# Patient Record
Sex: Female | Born: 1935 | Hispanic: Yes | Smoking: Never smoker
Health system: Southern US, Community
[De-identification: ages and names within clinical notes are randomized; demographics above are authoritative.]

## PROBLEM LIST (undated history)

## (undated) DIAGNOSIS — I10 Essential (primary) hypertension: Secondary | ICD-10-CM

---

## 2005-01-25 ENCOUNTER — Emergency Department: Payer: Self-pay | Admitting: Emergency Medicine

## 2006-02-21 ENCOUNTER — Emergency Department: Payer: Self-pay | Admitting: Emergency Medicine

## 2015-09-12 ENCOUNTER — Other Ambulatory Visit: Payer: Self-pay | Admitting: Internal Medicine

## 2015-09-12 DIAGNOSIS — R42 Dizziness and giddiness: Secondary | ICD-10-CM

## 2015-09-12 DIAGNOSIS — R413 Other amnesia: Secondary | ICD-10-CM

## 2015-10-02 ENCOUNTER — Ambulatory Visit: Payer: Medicare Other

## 2015-10-23 ENCOUNTER — Ambulatory Visit
Admission: RE | Admit: 2015-10-23 | Discharge: 2015-10-23 | Disposition: A | Discharge status: Home / Self Care | Payer: Medicare Other | Source: Ambulatory Visit | Attending: Internal Medicine | Admitting: Internal Medicine

## 2015-10-23 DIAGNOSIS — R42 Dizziness and giddiness: Secondary | ICD-10-CM | POA: Diagnosis present

## 2015-10-23 DIAGNOSIS — R2689 Other abnormalities of gait and mobility: Secondary | ICD-10-CM | POA: Diagnosis present

## 2015-10-23 DIAGNOSIS — R413 Other amnesia: Secondary | ICD-10-CM

## 2015-10-23 LAB — POCT I-STAT CREATININE: CREATININE: 0.5 mg/dL (ref 0.44–1.00)

## 2015-10-23 MED ORDER — GADOBENATE DIMEGLUMINE 529 MG/ML IV SOLN
10.0000 mL | Freq: Once | INTRAVENOUS | Status: AC | PRN
  Administered 2015-10-23: 9 mL via INTRAVENOUS

## 2016-03-19 ENCOUNTER — Other Ambulatory Visit: Payer: Self-pay | Admitting: Internal Medicine

## 2016-03-19 DIAGNOSIS — Z1239 Encounter for other screening for malignant neoplasm of breast: Secondary | ICD-10-CM

## 2016-03-27 ENCOUNTER — Encounter: Payer: Self-pay | Admitting: Emergency Medicine

## 2016-03-27 DIAGNOSIS — I10 Essential (primary) hypertension: Secondary | ICD-10-CM | POA: Diagnosis not present

## 2016-03-27 DIAGNOSIS — R030 Elevated blood-pressure reading, without diagnosis of hypertension: Secondary | ICD-10-CM | POA: Diagnosis present

## 2016-03-27 LAB — BASIC METABOLIC PANEL
Anion gap: 8 (ref 5–15)
BUN: 10 mg/dL (ref 6–20)
CHLORIDE: 106 mmol/L (ref 101–111)
CO2: 26 mmol/L (ref 22–32)
CREATININE: 0.49 mg/dL (ref 0.44–1.00)
Calcium: 9.4 mg/dL (ref 8.9–10.3)
GFR calc Af Amer: >60 mL/min (ref >60)
GFR calc non Af Amer: >60 mL/min (ref >60)
GLUCOSE: 108 mg/dL — AB (ref 65–99)
Potassium: 4.3 mmol/L (ref 3.5–5.1)
Sodium: 140 mmol/L (ref 135–145)

## 2016-03-27 LAB — CBC
HCT: 42.8 % (ref 35.0–47.0)
Hemoglobin: 15.1 g/dL (ref 12.0–16.0)
MCH: 31.8 pg (ref 26.0–34.0)
MCHC: 35.2 g/dL (ref 32.0–36.0)
MCV: 90.4 fL (ref 80.0–100.0)
PLATELETS: 136 10*3/uL — AB (ref 150–440)
RBC: 4.74 MIL/uL (ref 3.80–5.20)
RDW: 13.3 % (ref 11.5–14.5)
WBC: 8.4 10*3/uL (ref 3.6–11.0)

## 2016-03-27 LAB — TROPONIN I: Troponin I: <0.03 ng/mL (ref <0.03)

## 2016-03-27 NOTE — ED Triage Notes (Signed)
Patient reports that she checked her blood pressure at home today and the systolic was over 200. Patient has a history of hypertension but it had improved so her dr took her bp medication about a year ago.

## 2016-03-28 ENCOUNTER — Emergency Department
Admission: EM | Admit: 2016-03-28 | Discharge: 2016-03-28 | Disposition: A | Discharge status: Home / Self Care | Payer: Medicare Other | Attending: Emergency Medicine | Admitting: Emergency Medicine

## 2016-03-28 DIAGNOSIS — I10 Essential (primary) hypertension: Secondary | ICD-10-CM | POA: Diagnosis not present

## 2016-03-28 MED ORDER — HYDROCHLOROTHIAZIDE 12.5 MG PO TABS: 12.5000 mg | ORAL_TABLET | Freq: Every day | ORAL | 1 refills | Status: AC

## 2016-03-28 MED ORDER — HYDROCHLOROTHIAZIDE 12.5 MG PO CAPS
12.5000 mg | ORAL_CAPSULE | Freq: Once | ORAL | Status: AC
  Administered 2016-03-28: 12.5 mg via ORAL
  Filled 2016-03-28: qty 1

## 2016-03-28 NOTE — ED Notes (Addendum)
Patient presented to ED with complaints of hypertension. She has a history of hypertension. Family brought her in because her systolic pressure was registering over 200 at home. Patients pressure has steadily improved. Family has remained present at bedside during stay. Patient has no complaints of pain.

## 2016-03-28 NOTE — ED Provider Notes (Signed)
[facility] Emergency Department Provider Note  Time seen: 1:51 AM  I have reviewed the triage vital signs and the nursing notes.   HISTORY  Chief Complaint Hypertension    HPI Herma Meringdelina Brotz is a 80 y.o. female with a past medical history of hypertension off medications for the past one year who presents to the emergency department from them elevated blood pressure. According to the patient and family they took the patient's blood pressure tonight and it was 170 systolic, they waited approximately 30 minutes and took it again it was 185, the tech it again and it was over 200 so they brought the patient to the emergency department for evaluation. Patient denies any chest pain, daughter states she was feeling dizzy earlier today but does not currently. Patient's blood pressure initially upon arrival to the emergency department is 205 systolic, currently 170 systolic. Daughter states the patient was on blood pressure medications for many years but was taken off of them one year ago. Patient saw her doctor August 25, but states the blood pressure was elevated at that time but the doctor did not restart the medications. Per family the doctor told them that the blood pressure spikes up high and the need to get the emergency department for evaluation which is why they came tonight.  History reviewed. No pertinent past medical history.  There are no active problems to display for this patient.   History reviewed. No pertinent surgical history.  Prior to Admission medications   Not on File    No Known Allergies  No family history on file.  Social History Social History  Substance Use Topics  . Smoking status: Never Smoker  . Smokeless tobacco: Never Used  . Alcohol use Not on file    Review of Systems, Per patient and family. Constitutional: Negative for fever. Cardiovascular: Negative for chest pain. Respiratory: Negative for shortness of  breath. Neurological: Negative for headache 10-point ROS otherwise negative.  ____________________________________________   PHYSICAL EXAM:  VITAL SIGNS: ED Triage Vitals [03/27/16 2251]  Enc Vitals Group     BP (!) 205/76     Pulse Rate 63     Resp 18     Temp 98.2 F (36.8 C)     Temp Source Oral     SpO2 96 %     Weight 107 lb (48.5 kg)     Height 4\' 11"  (1.499 m)     Head Circumference      Peak Flow      Pain Score      Pain Loc      Pain Edu?      Excl. in GC?     Constitutional: Alert. Well appearing and in no distress. Eyes: Normal exam ENT   Head: Normocephalic and atraumatic.   Mouth/Throat: Mucous membranes are moist. Cardiovascular: Normal rate, regular rhythm.  Respiratory: Normal respiratory effort without tachypnea nor retractions. Breath sounds are clear Gastrointestinal: Soft and nontender. No distention.  Musculoskeletal: Nontender with normal range of motion in all extremities. No lower extremity tenderness or edema. Neurologic:  No gross focal neurologic deficits  Skin:  Skin is warm, dry and intact.  Psychiatric: Mood and affect are normal. S  ____________________________________________    EKG  EKG reviewed and interpreted by myself shows normal sinus rhythm at 62 bpm, narrow QRS, normal axis, normal intervals, no ST changes. Normal EKG.  ____________________________________________     INITIAL IMPRESSION / ASSESSMENT AND PLAN / ED COURSE  Pertinent labs &  imaging results that were available during my care of the patient were reviewed by me and considered in my medical decision making (see chart for details).  Patient presents to the emergency department with hypertension, greater 200 prior to arrival. Upon arrival patient's blood pressure is 205 systolic now 170. Patient has no complaints at this time. Patient's workup including labs and EKG are very normal. We will restart the patient on blood pressure medication and have her  follow-up with a primary care doctor in 1 week for recheck. Patient family are agreeable to this plan.  ____________________________________________   FINAL CLINICAL IMPRESSION(S) / ED DIAGNOSES  Hypertension    Minna Antis, MD 03/28/16 0157

## 2016-04-07 ENCOUNTER — Ambulatory Visit: Payer: Medicare Other | Attending: Internal Medicine

## 2016-05-05 ENCOUNTER — Other Ambulatory Visit: Payer: Self-pay | Admitting: Gastroenterology

## 2016-05-05 DIAGNOSIS — R7989 Other specified abnormal findings of blood chemistry: Secondary | ICD-10-CM

## 2016-05-05 DIAGNOSIS — R945 Abnormal results of liver function studies: Secondary | ICD-10-CM

## 2016-05-05 DIAGNOSIS — Z862 Personal history of diseases of the blood and blood-forming organs and certain disorders involving the immune mechanism: Secondary | ICD-10-CM

## 2016-05-12 ENCOUNTER — Ambulatory Visit
Admission: RE | Admit: 2016-05-12 | Discharge: 2016-05-12 | Disposition: A | Discharge status: Home / Self Care | Payer: Medicare Other | Source: Ambulatory Visit | Attending: Gastroenterology | Admitting: Gastroenterology

## 2016-05-12 DIAGNOSIS — Z862 Personal history of diseases of the blood and blood-forming organs and certain disorders involving the immune mechanism: Secondary | ICD-10-CM | POA: Diagnosis present

## 2016-05-12 DIAGNOSIS — R7989 Other specified abnormal findings of blood chemistry: Secondary | ICD-10-CM | POA: Diagnosis present

## 2016-05-12 DIAGNOSIS — K8020 Calculus of gallbladder without cholecystitis without obstruction: Secondary | ICD-10-CM | POA: Insufficient documentation

## 2016-05-12 DIAGNOSIS — R945 Abnormal results of liver function studies: Secondary | ICD-10-CM

## 2016-05-12 DIAGNOSIS — K760 Fatty (change of) liver, not elsewhere classified: Secondary | ICD-10-CM | POA: Diagnosis not present

## 2016-06-08 ENCOUNTER — Other Ambulatory Visit
Admission: RE | Admit: 2016-06-08 | Discharge: 2016-06-08 | Disposition: A | Discharge status: Home / Self Care | Payer: Medicare Other | Source: Ambulatory Visit | Attending: Neurology | Admitting: Neurology

## 2016-06-08 DIAGNOSIS — R413 Other amnesia: Secondary | ICD-10-CM | POA: Diagnosis present

## 2016-06-08 LAB — AMMONIA: AMMONIA: 18 umol/L (ref 9–35)

## 2016-07-14 ENCOUNTER — Encounter: Payer: Self-pay | Admitting: Emergency Medicine

## 2016-07-14 ENCOUNTER — Emergency Department
Admission: EM | Admit: 2016-07-14 | Discharge: 2016-07-14 | Disposition: A | Discharge status: Home / Self Care | Payer: Medicare Other | Attending: Emergency Medicine | Admitting: Emergency Medicine

## 2016-07-14 DIAGNOSIS — R109 Unspecified abdominal pain: Secondary | ICD-10-CM | POA: Insufficient documentation

## 2016-07-14 DIAGNOSIS — I10 Essential (primary) hypertension: Secondary | ICD-10-CM | POA: Diagnosis not present

## 2016-07-14 DIAGNOSIS — Z79899 Other long term (current) drug therapy: Secondary | ICD-10-CM | POA: Insufficient documentation

## 2016-07-14 DIAGNOSIS — R197 Diarrhea, unspecified: Secondary | ICD-10-CM | POA: Diagnosis not present

## 2016-07-14 HISTORY — DX: Essential (primary) hypertension: I10

## 2016-07-14 LAB — CBC
HEMATOCRIT: 39.4 % (ref 35.0–47.0)
HEMOGLOBIN: 13.8 g/dL (ref 12.0–16.0)
MCH: 31.7 pg (ref 26.0–34.0)
MCHC: 35.1 g/dL (ref 32.0–36.0)
MCV: 90.4 fL (ref 80.0–100.0)
Platelets: 135 10*3/uL — ABNORMAL LOW (ref 150–440)
RBC: 4.35 MIL/uL (ref 3.80–5.20)
RDW: 13.1 % (ref 11.5–14.5)
WBC: 5.6 10*3/uL (ref 3.6–11.0)

## 2016-07-14 LAB — COMPREHENSIVE METABOLIC PANEL
ALT: 45 U/L (ref 14–54)
ANION GAP: 9 (ref 5–15)
AST: 65 U/L — ABNORMAL HIGH (ref 15–41)
Albumin: 4.2 g/dL (ref 3.5–5.0)
Alkaline Phosphatase: 74 U/L (ref 38–126)
BILIRUBIN TOTAL: 0.7 mg/dL (ref 0.3–1.2)
BUN: 11 mg/dL (ref 6–20)
CO2: 26 mmol/L (ref 22–32)
Calcium: 8.7 mg/dL — ABNORMAL LOW (ref 8.9–10.3)
Chloride: 104 mmol/L (ref 101–111)
Creatinine, Ser: 0.53 mg/dL (ref 0.44–1.00)
GFR calc Af Amer: >60 mL/min (ref >60)
Glucose, Bld: 111 mg/dL — ABNORMAL HIGH (ref 65–99)
POTASSIUM: 3.1 mmol/L — AB (ref 3.5–5.1)
Sodium: 139 mmol/L (ref 135–145)
TOTAL PROTEIN: 7.5 g/dL (ref 6.5–8.1)

## 2016-07-14 LAB — URINALYSIS, ROUTINE W REFLEX MICROSCOPIC
BILIRUBIN URINE: NEGATIVE
Glucose, UA: NEGATIVE mg/dL
KETONES UR: NEGATIVE mg/dL
NITRITE: NEGATIVE
PH: 8 (ref 5.0–8.0)
Protein, ur: NEGATIVE mg/dL
Specific Gravity, Urine: 1.003 — ABNORMAL LOW (ref 1.005–1.030)

## 2016-07-14 LAB — MAGNESIUM: Magnesium: 2.5 mg/dL — ABNORMAL HIGH (ref 1.7–2.4)

## 2016-07-14 LAB — LIPASE, BLOOD: Lipase: 36 U/L (ref 11–51)

## 2016-07-14 MED ORDER — POTASSIUM CHLORIDE CRYS ER 20 MEQ PO TBCR
40.0000 meq | EXTENDED_RELEASE_TABLET | Freq: Once | ORAL | Status: AC
  Administered 2016-07-14: 40 meq via ORAL
  Filled 2016-07-14: qty 2

## 2016-07-14 MED ORDER — AMLODIPINE BESYLATE 5 MG PO TABS
5.0000 mg | ORAL_TABLET | Freq: Once | ORAL | Status: DC
  Filled 2016-07-14: qty 1

## 2016-07-14 MED ORDER — POTASSIUM CHLORIDE CRYS ER 20 MEQ PO TBCR: 20.0000 meq | EXTENDED_RELEASE_TABLET | Freq: Every day | ORAL | 0 refills | Status: AC

## 2016-07-14 NOTE — ED Provider Notes (Signed)
[facility] Emergency Department Provider Note  ____________________________________________   First MD Initiated Contact with Patient 07/14/16 2042     (approximate)  I have reviewed the triage vital signs and the nursing notes.   HISTORY  Chief Complaint Abdominal Pain and Diarrhea  The patient and/or family speak(s) Spanish.  They understand they have the right to the use of a hospital interpreter, however at this time they prefer to speak directly with me in Spanish.  They know that they can ask for an interpreter at any time.   HPI Carla Cabrera is a 80 y.o. female with only a PMH of HTN who presents for 2 days of diarrhea and cramping abdominal pain.  Started with pain and bloating after eating about 2 days ago, felt distended and gassy, then developed copious watery diarrhea.  Numerous episodes of diarrhea over the last 2 days, though it has been better today.  Denies nausea and vomiting.  No other family members have been ill.  Denies fever/chills, CP, SOB, dysuria.  Severe intensity, nothing makes better or worse, but better today; family just concerned about persistent bloated sensation, diarrhea, and intermittent cramping abdominal pain.   Past Medical History:  Diagnosis Date  . Hypertension     There are no active problems to display for this patient.   History reviewed. No pertinent surgical history.  Prior to Admission medications   Medication Sig Start Date End Date Taking? Authorizing Provider  hydrochlorothiazide (HYDRODIURIL) 12.5 MG tablet Take 1 tablet (12.5 mg total) by mouth daily. 03/28/16   Minna Antis, MD  potassium chloride SA (KLOR-CON M20) 20 MEQ tablet Take 1 tablet (20 mEq total) by mouth daily. 07/14/16   Loleta Rose, MD    Allergies Patient has no known allergies.  No family history on file.  Social History Social History  Substance Use Topics  . Smoking status: Never Smoker  . Smokeless tobacco: Never  Used  . Alcohol use No    Review of Systems Constitutional: No fever/chills Eyes: No visual changes. ENT: No sore throat. Cardiovascular: Denies chest pain. Respiratory: Denies shortness of breath. Gastrointestinal: Cramping abdominal pain.  No nausea, no vomiting.  Copious diarrhea.   Genitourinary: Negative for dysuria. Musculoskeletal: Negative for back pain. Skin: Negative for rash. Neurological: Negative for headaches, focal weakness or numbness.  10-point ROS otherwise negative.  ____________________________________________   PHYSICAL EXAM:  VITAL SIGNS: ED Triage Vitals   Enc Vitals Group     BP (!) 181/58     Pulse Rate 62     Resp 18     Temp 98.3 F (36.8 C)     Temp Source Oral     SpO2 97 %     Weight 107 lb (48.5 kg)     Height 4\' 11"  (1.499 m)     Head Circumference      Peak Flow      Pain Score 4     Pain Loc      Pain Edu?      Excl. in GC?     Constitutional: Alert and oriented. Well appearing and in no acute distress. Eyes: Conjunctivae are normal. PERRL. EOMI. Head: Atraumatic. Nose: No congestion/rhinnorhea. Mouth/Throat: Mucous membranes are moist.  Oropharynx non-erythematous. Neck: No stridor.  No meningeal signs.   Cardiovascular: Normal rate, regular rhythm. Good peripheral circulation. Grossly normal heart sounds. Respiratory: Normal respiratory effort.  No retractions. Lungs CTAB. Gastrointestinal: Soft and nontender. No distention on my exam.   Musculoskeletal:  No lower extremity tenderness nor edema. No gross deformities of extremities. Neurologic:  Normal speech and language. No gross focal neurologic deficits are appreciated.  Skin:  Skin is warm, dry and intact. No rash noted. Psychiatric: Mood and affect are normal. Speech and behavior are normal.  ____________________________________________   LABS (all labs ordered are listed, but only abnormal results are displayed)  Labs Reviewed  COMPREHENSIVE METABOLIC PANEL -  Abnormal; Notable for the following:       Result Value   Potassium 3.1 (*)    Glucose, Bld 111 (*)    Calcium 8.7 (*)    AST 65 (*)    All other components within normal limits  CBC - Abnormal; Notable for the following:    Platelets 135 (*)    All other components within normal limits  URINALYSIS, ROUTINE W REFLEX MICROSCOPIC - Abnormal; Notable for the following:    Color, Urine COLORLESS (*)    APPearance CLEAR (*)    Specific Gravity, Urine 1.003 (*)    Hgb urine dipstick SMALL (*)    Leukocytes, UA SMALL (*)    Bacteria, UA RARE (*)    Squamous Epithelial / LPF 0-5 (*)    All other components within normal limits  MAGNESIUM - Abnormal; Notable for the following:    Magnesium 2.5 (*)    All other components within normal limits  LIPASE, BLOOD   ____________________________________________  EKG  ED ECG REPORT I, Jlynn Ly, the attending physician, personally viewed and interpreted this ECG.  Date: 07/14/2016 EKG Time: 20:39 Rate: 66 Rhythm: normal sinus rhythm QRS Axis: normal Intervals: normal ST/T Wave abnormalities: Non-specific ST segment / T-wave changes, but no evidence of acute ischemia. Conduction Disturbances: none Narrative Interpretation: unremarkable  ____________________________________________  RADIOLOGY   No results found.  ____________________________________________   PROCEDURES  Procedure(s) performed:   Procedures   Critical Care performed: No ____________________________________________   INITIAL IMPRESSION / ASSESSMENT AND PLAN / ED COURSE  Pertinent labs & imaging results that were available during my care of the patient were reviewed by me and considered in my medical decision making (see chart for details).  Well-appearing, NAD, steady gait with no ambulation difficulties.  BP slightly elevated, otherwise vitals stable.  Absolutely NO tenderness to palpation of the abdomen, even to deep palpation.  Suspect viral cause,  and symptoms are improving.  Will check labs for lytes.  She has not been on antibiotics recently and she is actually improving so I do not feel that stool studies are necessary.  Also, I feel that a CT scan is not necessary based on the fact that she has actually no tenderness to palpation of the abdomen and the oral contrast will just make her diarrhea worse.  I will attempt to provide reassurance and discharged for outpatient follow-up if her labs are reassuring.  Clinical Course as of Jul 15 2307  Wed Jul 14, 2016  2212 Patient's blood pressure keeps becoming more and more elevated although she is asymptomatic.  Family very concerned.  Systolic now 203.  Will give oral amlodipine; trying to avoid IV medications that may have side effects or too dramatic a drop in pressure.  [CF]  2256 The patient's blood pressure is coming down on its own.  It is still elevated but she remains completely asymptomatic from the blood pressure.  I reassessed her and she has no abdominal tenderness and no more pain.  She has not had any diarrhea in the ED.  I canceled  amlodipine and had my usual and customary asymptomatic hypertension discussion with the patient and family.  They understand and will call her primary care doctor in the morning.  Given the low potassium which I repleted I will give a short prescription for potassium supplements until she can follow-up with her doctor.I gave my usual and customary return precautions.   [CF]    Clinical Course User Index [CF] Loleta Roseory Kafi Dotter, MD    ____________________________________________  FINAL CLINICAL IMPRESSION(S) / ED DIAGNOSES  Final diagnoses:  Diarrhea of presumed infectious origin     MEDICATIONS GIVEN DURING THIS VISIT:  Medications  potassium chloride SA (K-DUR,KLOR-CON) CR tablet 40 mEq (40 mEq Oral Given 07/14/16 2140)     NEW OUTPATIENT MEDICATIONS STARTED DURING THIS VISIT:  New Prescriptions   POTASSIUM CHLORIDE SA (KLOR-CON M20) 20 MEQ  TABLET    Take 1 tablet (20 mEq total) by mouth daily.    Modified Medications   No medications on file    Discontinued Medications   No medications on file     Note:  This document was prepared using Dragon voice recognition software and may include unintentional dictation errors.    Loleta Roseory Rebecca Cairns, MD 07/14/16 [id number]

## 2016-07-14 NOTE — ED Notes (Signed)
Discharge instructions reviewed with patient. Questions fielded by this RN. Patient verbalizes understanding of instructions. Patient discharged home in stable condition per Forbach MD . No acute distress noted at time of discharge.   

## 2016-07-14 NOTE — ED Notes (Signed)
Pt ambulatory to restroom with no assistance.

## 2016-07-14 NOTE — ED Notes (Addendum)
Pt c/o epigastric pain with diarrhea after pain x few days. Non tender to palpation. Pt speaks spanish, daughter speaks english. Denied wanting interpreter.

## 2016-07-14 NOTE — ED Triage Notes (Signed)
Pt presents to ED with c/o intermittent epigastric cramping and frequent diarrhea for the past 2 days. Pain increases after attempting to eat or drink. Pt ambulatory to triage with steady gait. No increased work of breathing or acute distress noted at this time. Skin warm and dry.

## 2016-09-12 IMAGING — MR MR HEAD WO/W CM
10 of 12 series · 39 of 48 positions shown · IV contrast (9 ML MULTIHANCE)
Comparison: None.

CLINICAL DATA: Dizziness and short-term memory loss.

EXAM:
MRI HEAD WITHOUT AND WITH CONTRAST
TECHNIQUE: Multiplanar, multiecho pulse sequences of the brain and surrounding
structures were obtained without and with intravenous contrast.
CONTRAST:  9mL MULTIHANCE GADOBENATE DIMEGLUMINE 529 MG/ML IV SOLN

[Series 4: DWI · axial · 4.0mm · 0.94mm/px · z∈[-48,+111]mm · 5 of 41 slices shown (1 of 4)]
[im 1/41]
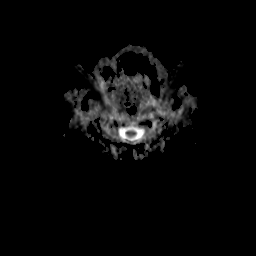
[im 11/41]
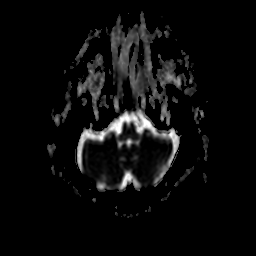
[im 21/41]
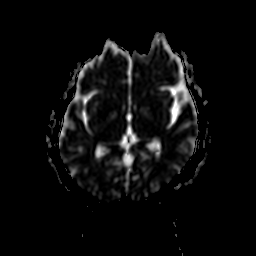
[im 31/41]
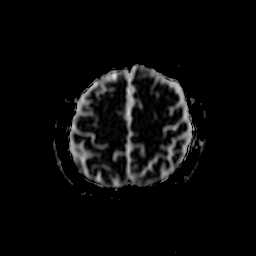
[im 41/41]
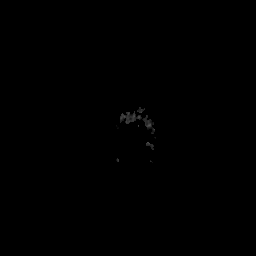

[Series 6: DWI · coronal · 5.0mm · 1.80mm/px · 4 of 35 slices shown (2 of 4)]
[im 1/35]
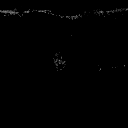
[im 12/35]
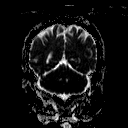
[im 23/35]
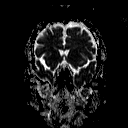
[im 35/35]
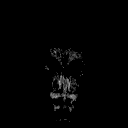

[Series 7: DWI · axial · 4.0mm · 0.94mm/px · z∈[-48,+111]mm · 5 of 41 slices shown (3 of 4)]
[im 1/41]
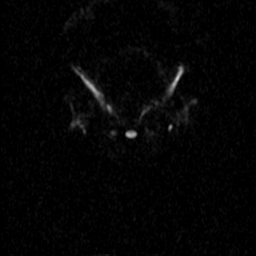
[im 11/41]
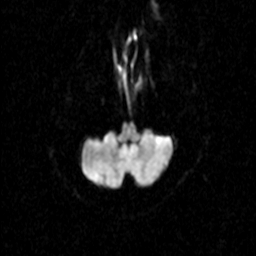
[im 21/41]
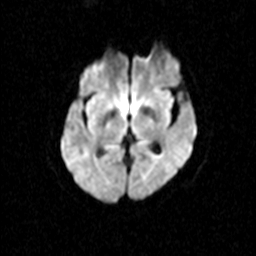
[im 31/41]
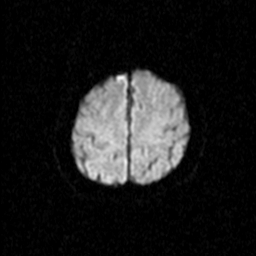
[im 41/41]
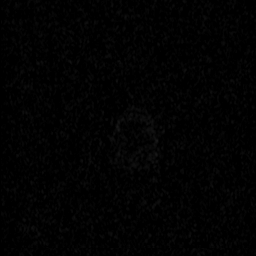

[Series 8: DWI · coronal · 5.0mm · 1.80mm/px · 4 of 34 slices shown (4 of 4)]
[im 1/34]
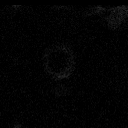
[im 12/34]
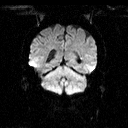
[im 23/34]
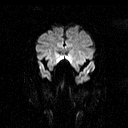
[im 34/34]
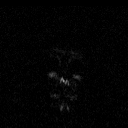

[Series 9: T2 · axial · 5.0mm · 0.45mm/px · z∈[-48,+108]mm · 3 of 25 slices shown (1 of 2)]
[im 1/25]
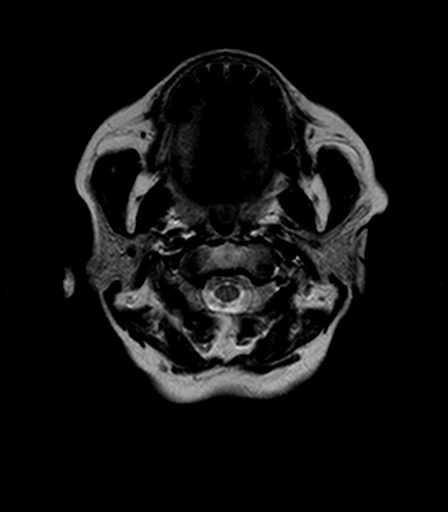
[im 13/25]
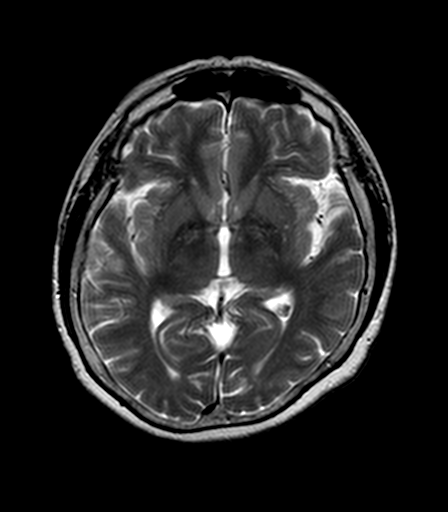
[im 25/25]
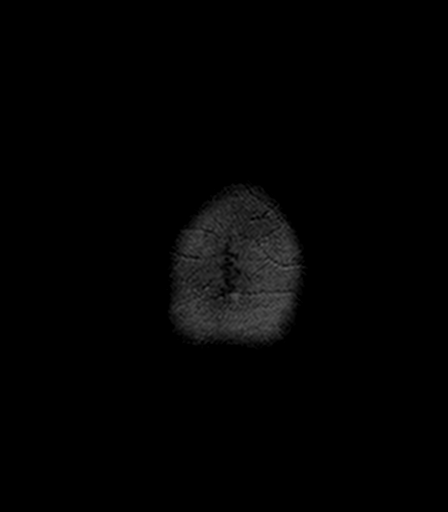

[Series 10: FLAIR · axial · 5.0mm · 0.90mm/px · z∈[-48,+108]mm · 3 of 25 slices shown]
[im 1/25]
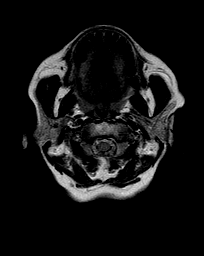
[im 13/25]
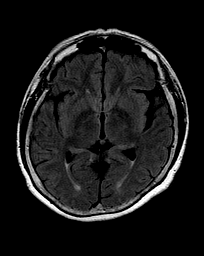
[im 25/25]
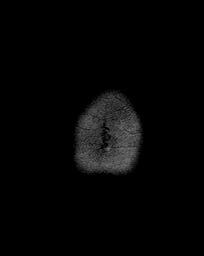

[Series 11: T2 · axial · 5.0mm · 0.45mm/px · z∈[-48,+108]mm · 3 of 25 slices shown (2 of 2)]
[im 1/25]
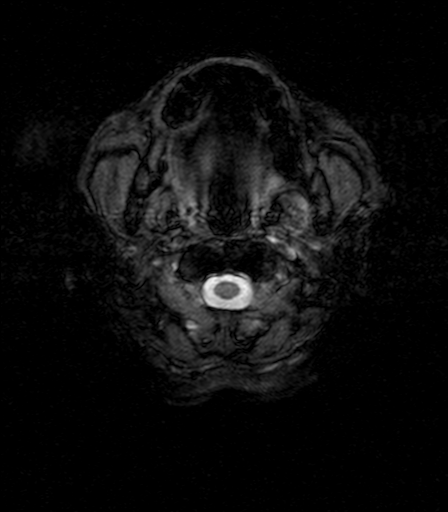
[im 13/25]
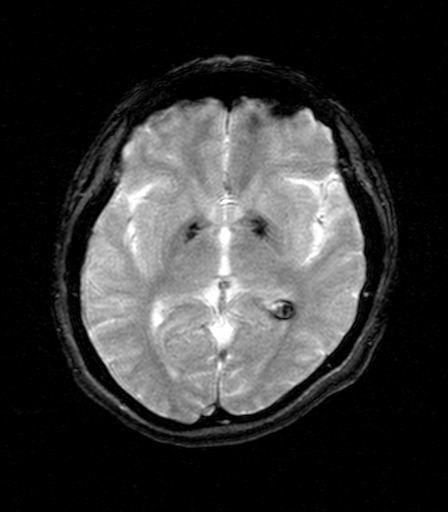
[im 25/25]
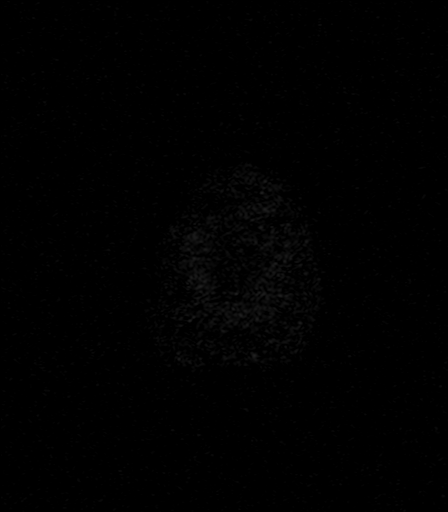

[Series 13: T2 post-contrast · coronal · 5.0mm · 0.45mm/px · 3 of 27 slices shown]
[im 1/27]
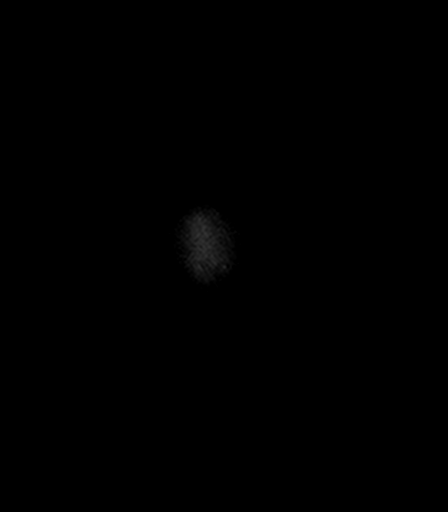
[im 14/27]
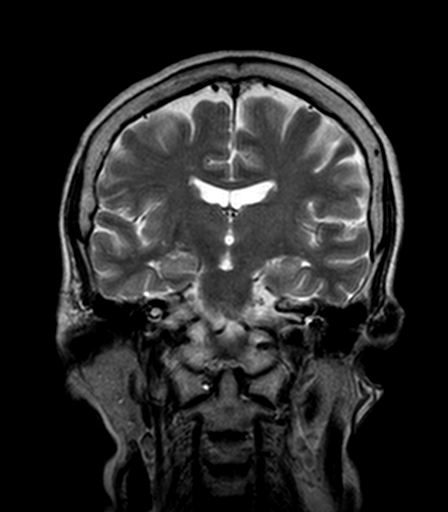
[im 27/27]
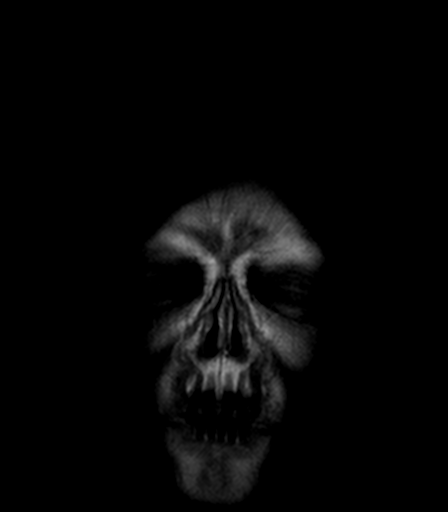

[Series 14: T1 post-contrast · axial · 3.0mm · 0.45mm/px · z∈[-46,+107]mm · 6 of 52 slices shown (1 of 2)]
[im 1/52]
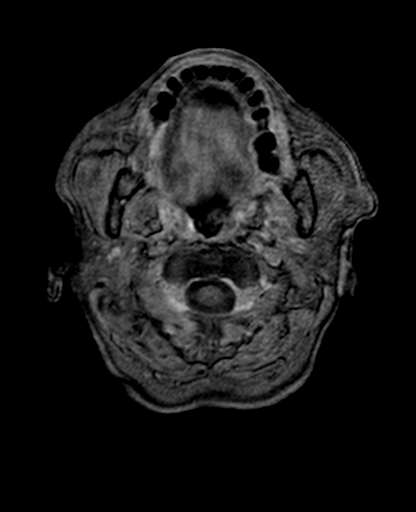
[im 11/52]
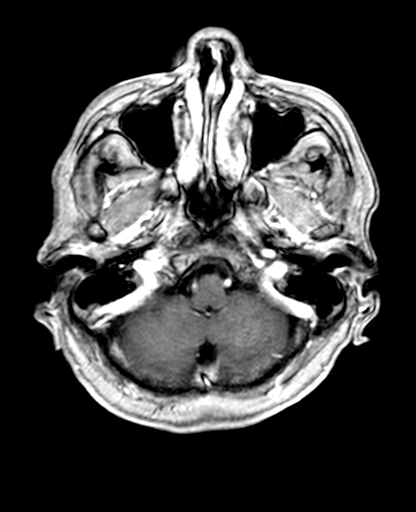
[im 21/52]
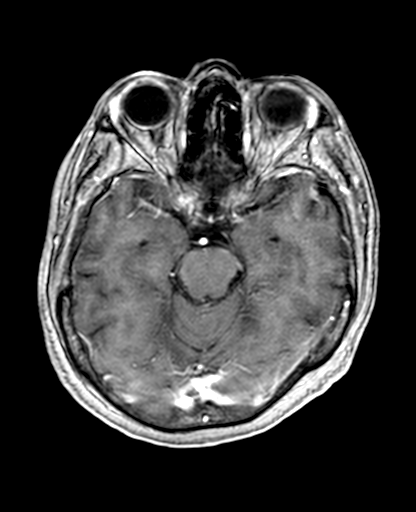
[im 31/52]
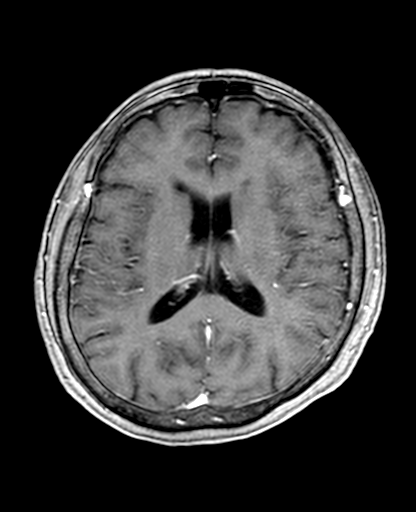
[im 41/52]
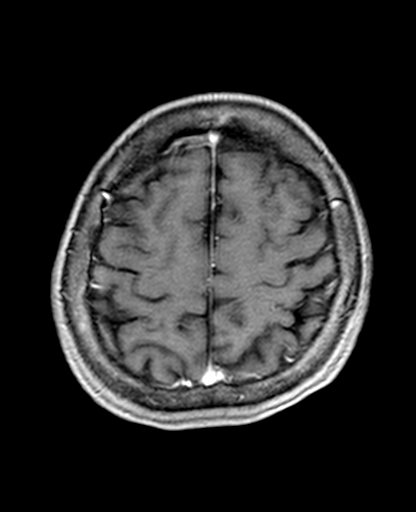
[im 52/52]
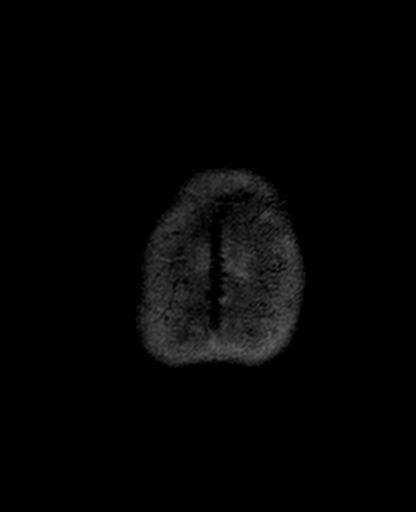

[Series 15: T1 post-contrast · coronal · 5.0mm · 0.45mm/px · 3 of 27 slices shown (2 of 2)]
[im 1/27]
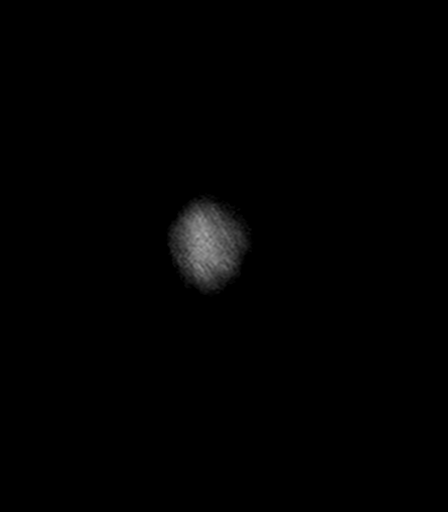
[im 14/27]
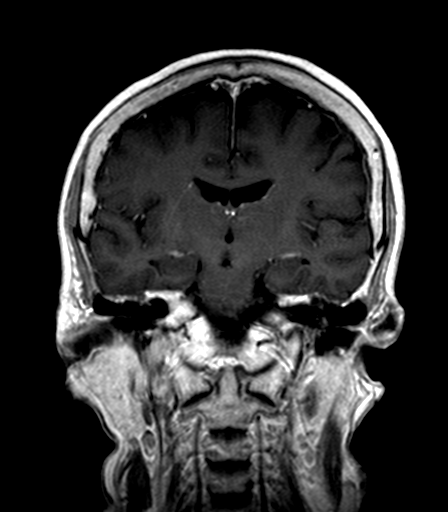
[im 27/27]
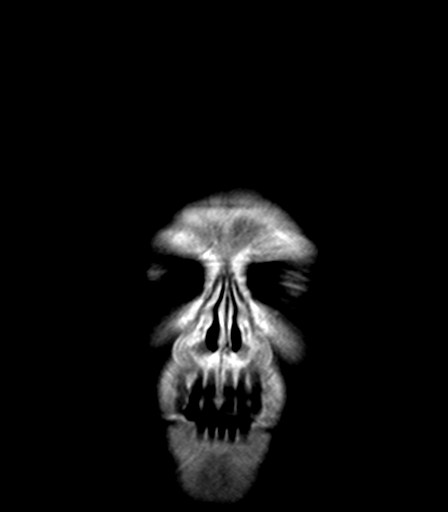

[39 of 48 positions shown; findings below may reference images not displayed]

FINDINGS: Calvarium and upper cervical spine: No focal marrow signal
abnormality.

Orbits: Bilateral cataract resection.

Sinuses and Mastoids: Clear.

Brain: No acute or remote infarct, hemorrhage, hydrocephalus, or
mass lesion. No evidence of large vessel occlusion. Normal brain
volume and white matter appearance for age. No abnormal enhancement.
IMPRESSION: Normal brain MRI for age.  No explanation for symptoms.

## 2017-05-24 IMAGING — US US ABDOMEN LIMITED
1 series · 14 of 25 positions shown · non-contrast
Comparison: None.

CLINICAL DATA: Abnormal liver function tests.

EXAM:
US ABDOMEN LIMITED - RIGHT UPPER QUADRANT

[Series 1: us abdomen limited · 0.17mm/px · 14 of 74 slices shown]
[im 1/74]
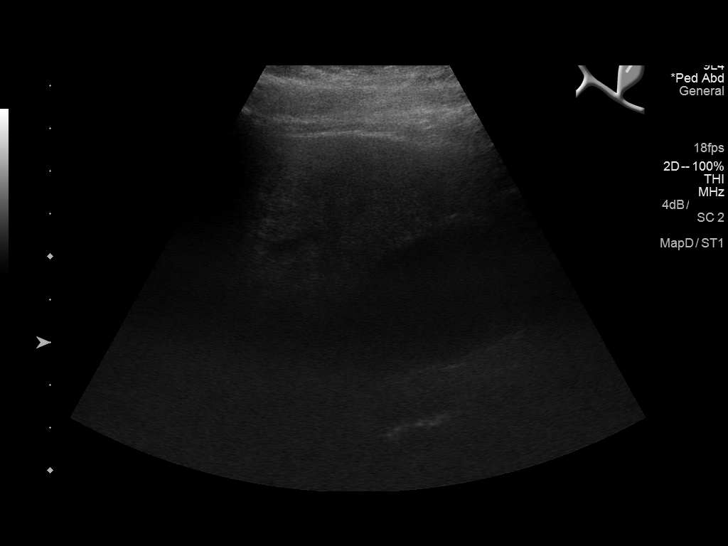
[im 7/74]
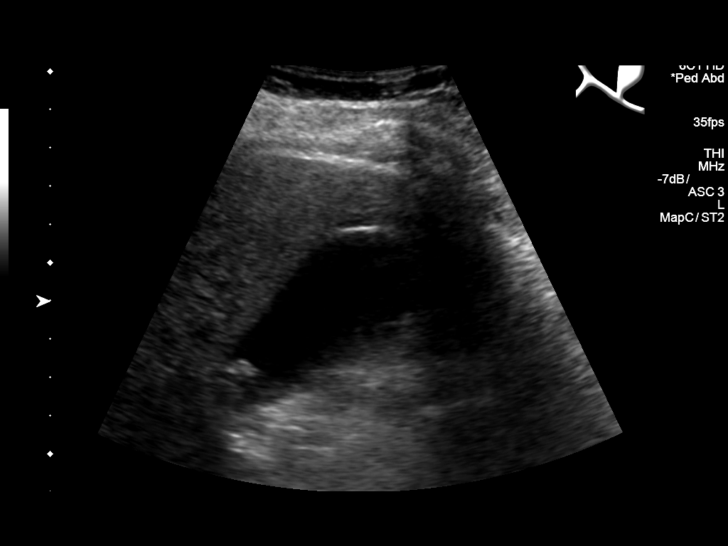
[im 13/74]
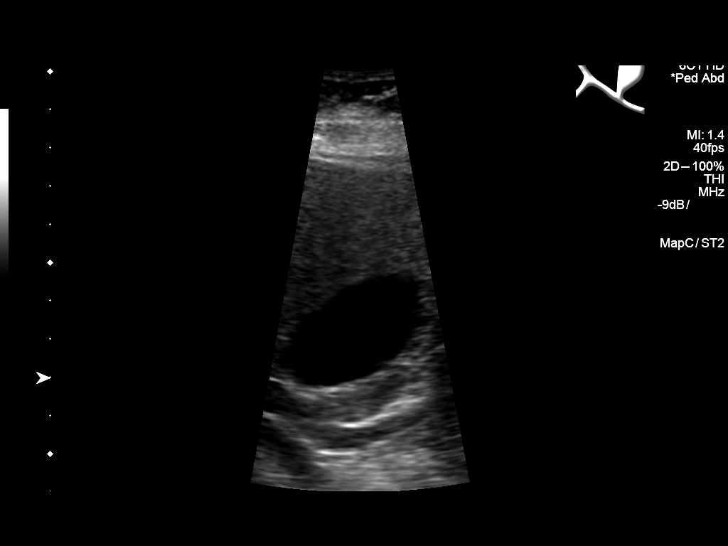
[im 19/74]
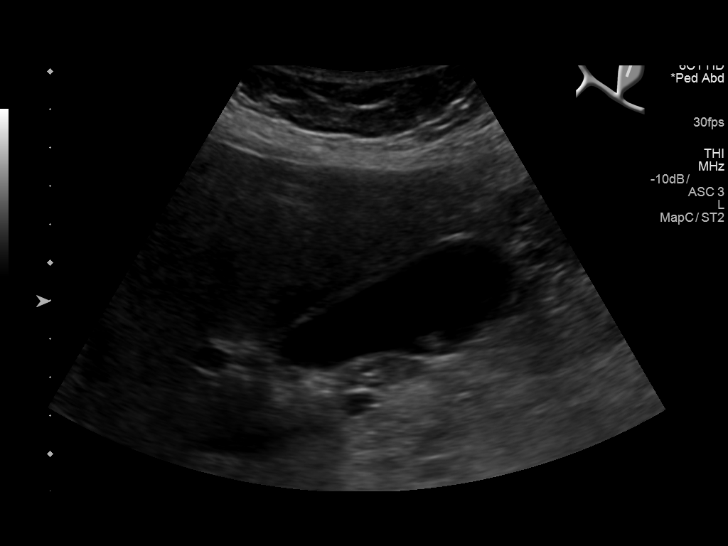
[im 25/74]
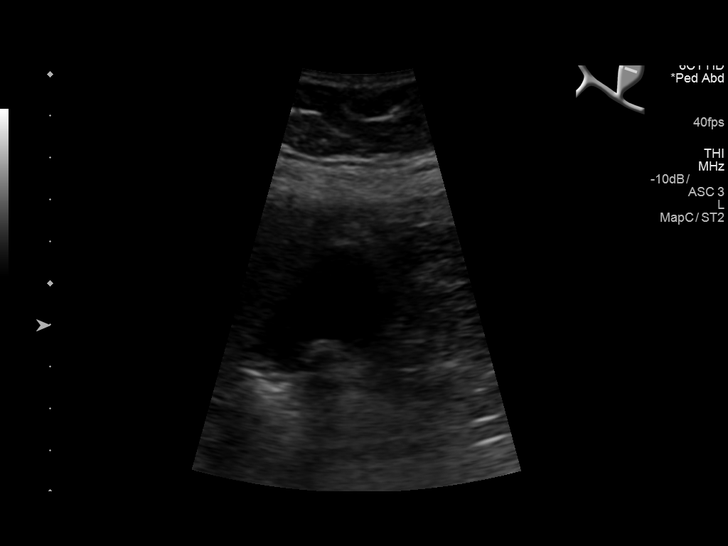
[im 28/74]
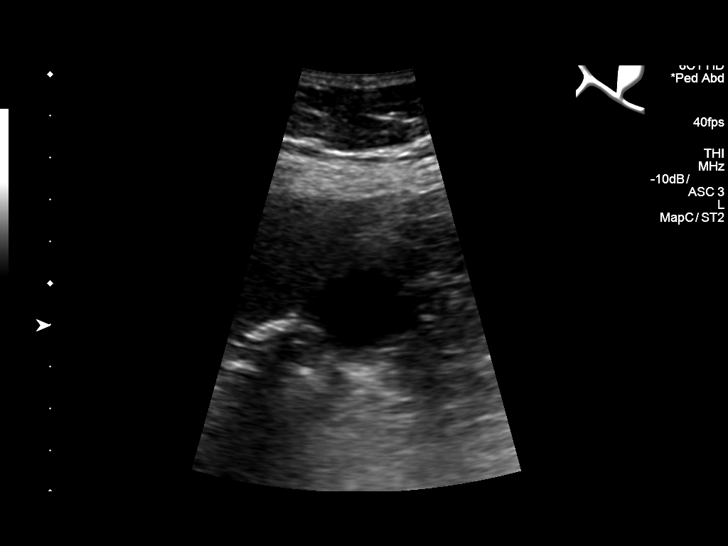
[im 34/74]
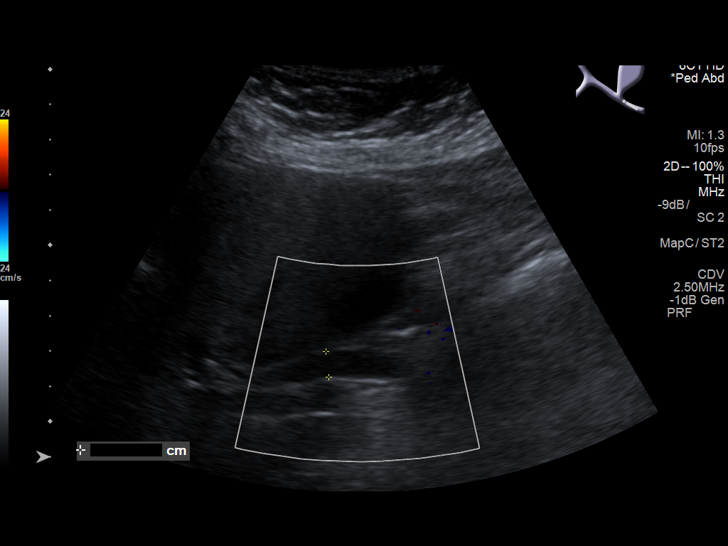
[im 40/74]
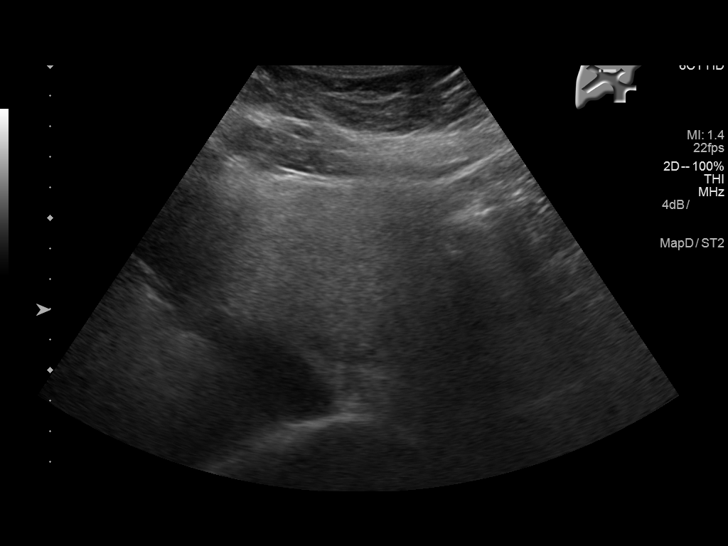
[im 46/74]
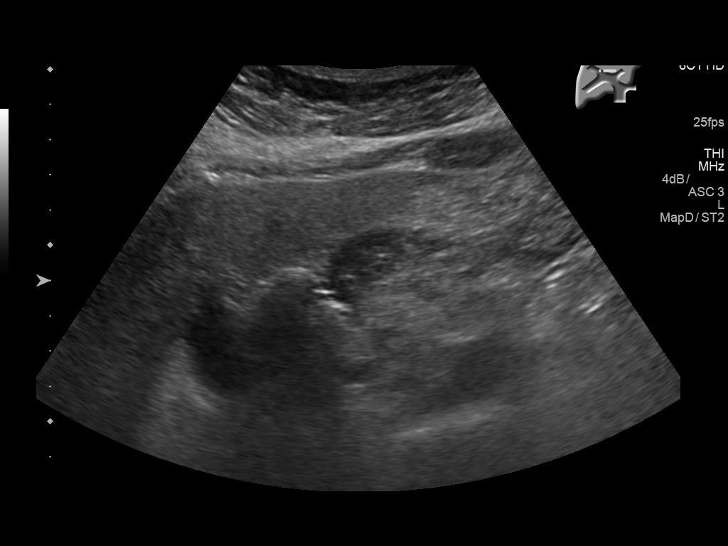
[im 49/74]
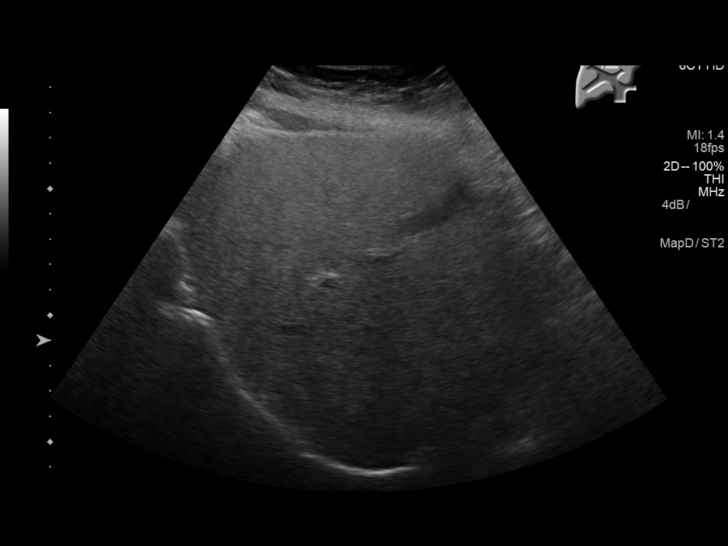
[im 55/74]
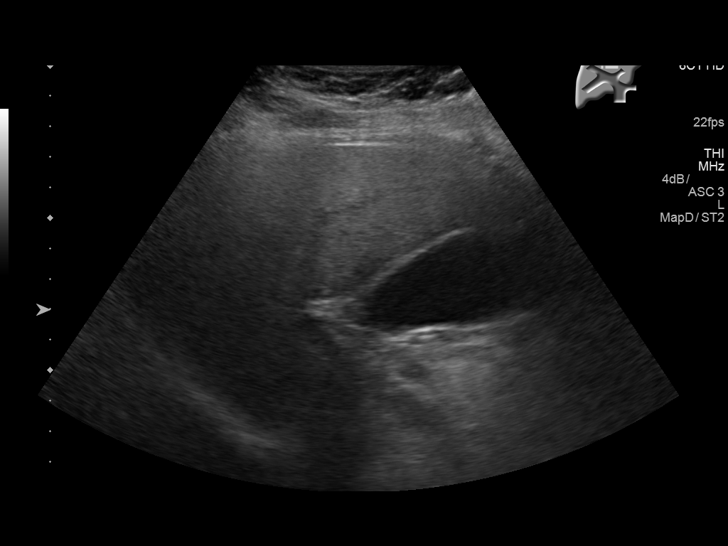
[im 61/74]
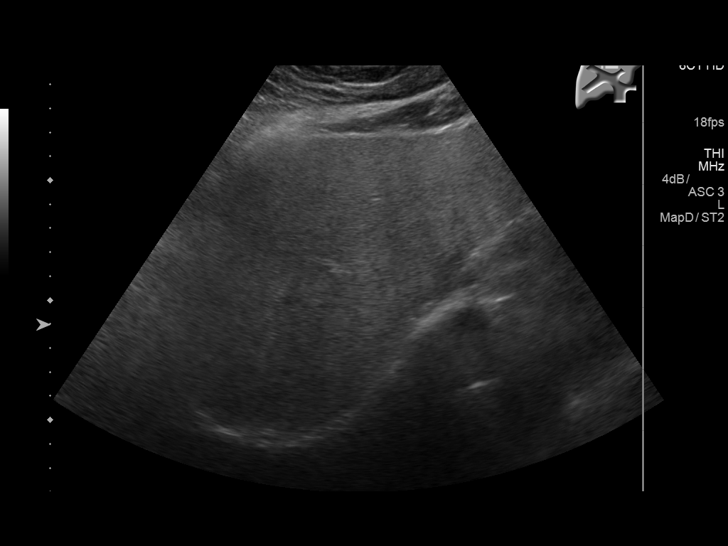
[im 67/74]
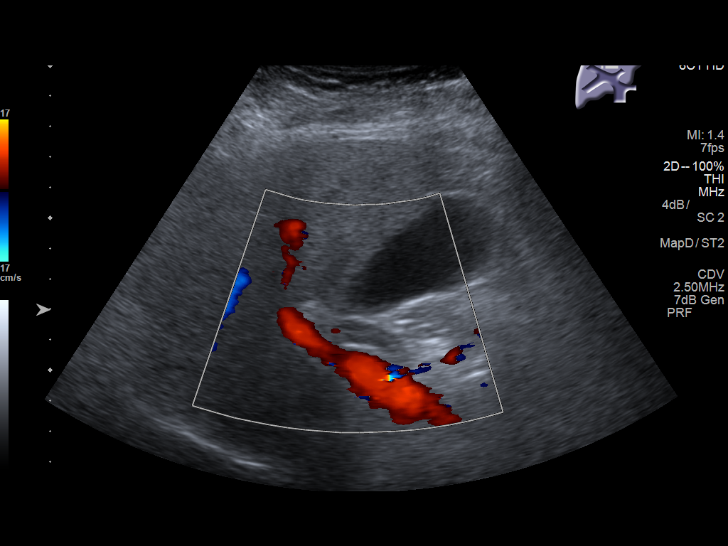
[im 74/74]
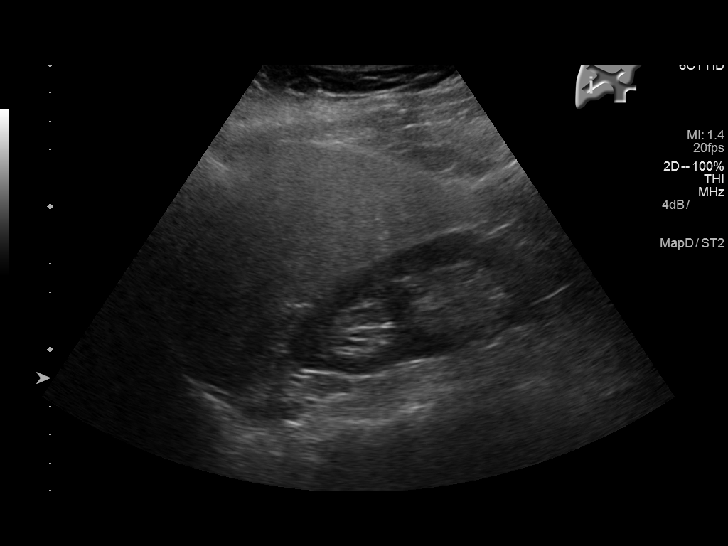

[14 of 25 positions shown; findings below may reference images not displayed]

FINDINGS: Gallbladder:

Multiple mobile gallstones. Normal wall thickness. Negative
sonographic Murphy sign.

Common bile duct:

Diameter: 8.2 mm, upper limits of normal. No dilated intrahepatic
bile ducts.

Liver:

Echogenic liver parenchyma consistent with hepatic steatosis.
IMPRESSION: 1. Hepatic steatosis.
2. Cholelithiasis.

## 2017-11-15 ENCOUNTER — Other Ambulatory Visit: Payer: Self-pay | Admitting: Internal Medicine

## 2017-11-15 DIAGNOSIS — Z1231 Encounter for screening mammogram for malignant neoplasm of breast: Secondary | ICD-10-CM

## 2023-02-16 DIAGNOSIS — R7303 Prediabetes: Secondary | ICD-10-CM | POA: Diagnosis not present

## 2023-02-16 DIAGNOSIS — R300 Dysuria: Secondary | ICD-10-CM | POA: Diagnosis not present

## 2023-02-16 DIAGNOSIS — Z0000 Encounter for general adult medical examination without abnormal findings: Secondary | ICD-10-CM | POA: Diagnosis not present

## 2023-02-16 DIAGNOSIS — E7800 Pure hypercholesterolemia, unspecified: Secondary | ICD-10-CM | POA: Diagnosis not present

## 2023-02-16 DIAGNOSIS — R058 Other specified cough: Secondary | ICD-10-CM | POA: Diagnosis not present

## 2023-02-24 DIAGNOSIS — M810 Age-related osteoporosis without current pathological fracture: Secondary | ICD-10-CM | POA: Diagnosis not present

## 2023-03-07 DIAGNOSIS — I10 Essential (primary) hypertension: Secondary | ICD-10-CM | POA: Diagnosis not present

## 2023-03-07 DIAGNOSIS — E119 Type 2 diabetes mellitus without complications: Secondary | ICD-10-CM | POA: Diagnosis not present

## 2023-03-07 DIAGNOSIS — R058 Other specified cough: Secondary | ICD-10-CM | POA: Diagnosis not present

## 2023-03-07 DIAGNOSIS — J189 Pneumonia, unspecified organism: Secondary | ICD-10-CM | POA: Diagnosis not present

## 2023-03-29 DIAGNOSIS — M810 Age-related osteoporosis without current pathological fracture: Secondary | ICD-10-CM | POA: Diagnosis not present

## 2023-03-29 DIAGNOSIS — Z23 Encounter for immunization: Secondary | ICD-10-CM | POA: Diagnosis not present

## 2023-05-17 DIAGNOSIS — I10 Essential (primary) hypertension: Secondary | ICD-10-CM | POA: Diagnosis not present

## 2023-05-17 DIAGNOSIS — E119 Type 2 diabetes mellitus without complications: Secondary | ICD-10-CM | POA: Diagnosis not present

## 2023-05-17 DIAGNOSIS — R748 Abnormal levels of other serum enzymes: Secondary | ICD-10-CM | POA: Diagnosis not present

## 2023-05-17 DIAGNOSIS — E7800 Pure hypercholesterolemia, unspecified: Secondary | ICD-10-CM | POA: Diagnosis not present

## 2023-05-24 DIAGNOSIS — E119 Type 2 diabetes mellitus without complications: Secondary | ICD-10-CM | POA: Diagnosis not present

## 2023-05-24 DIAGNOSIS — R748 Abnormal levels of other serum enzymes: Secondary | ICD-10-CM | POA: Diagnosis not present

## 2023-05-24 DIAGNOSIS — E7800 Pure hypercholesterolemia, unspecified: Secondary | ICD-10-CM | POA: Diagnosis not present

## 2023-05-24 DIAGNOSIS — I10 Essential (primary) hypertension: Secondary | ICD-10-CM | POA: Diagnosis not present

## 2023-11-15 DIAGNOSIS — I10 Essential (primary) hypertension: Secondary | ICD-10-CM | POA: Diagnosis not present

## 2023-11-15 DIAGNOSIS — Z0000 Encounter for general adult medical examination without abnormal findings: Secondary | ICD-10-CM | POA: Diagnosis not present

## 2023-11-15 DIAGNOSIS — M810 Age-related osteoporosis without current pathological fracture: Secondary | ICD-10-CM | POA: Diagnosis not present

## 2023-11-15 DIAGNOSIS — K7581 Nonalcoholic steatohepatitis (NASH): Secondary | ICD-10-CM | POA: Diagnosis not present

## 2023-11-15 DIAGNOSIS — E7800 Pure hypercholesterolemia, unspecified: Secondary | ICD-10-CM | POA: Diagnosis not present

## 2023-11-15 DIAGNOSIS — E119 Type 2 diabetes mellitus without complications: Secondary | ICD-10-CM | POA: Diagnosis not present

## 2023-11-15 DIAGNOSIS — K219 Gastro-esophageal reflux disease without esophagitis: Secondary | ICD-10-CM | POA: Diagnosis not present

## 2023-11-15 DIAGNOSIS — R748 Abnormal levels of other serum enzymes: Secondary | ICD-10-CM | POA: Diagnosis not present

## 2024-04-20 DIAGNOSIS — R748 Abnormal levels of other serum enzymes: Secondary | ICD-10-CM | POA: Diagnosis not present

## 2024-04-20 DIAGNOSIS — I10 Essential (primary) hypertension: Secondary | ICD-10-CM | POA: Diagnosis not present

## 2024-04-20 DIAGNOSIS — K219 Gastro-esophageal reflux disease without esophagitis: Secondary | ICD-10-CM | POA: Diagnosis not present

## 2024-04-20 DIAGNOSIS — E7800 Pure hypercholesterolemia, unspecified: Secondary | ICD-10-CM | POA: Diagnosis not present

## 2024-04-20 DIAGNOSIS — M810 Age-related osteoporosis without current pathological fracture: Secondary | ICD-10-CM | POA: Diagnosis not present

## 2024-04-20 DIAGNOSIS — S32000A Wedge compression fracture of unspecified lumbar vertebra, initial encounter for closed fracture: Secondary | ICD-10-CM | POA: Diagnosis not present

## 2024-04-20 DIAGNOSIS — R7303 Prediabetes: Secondary | ICD-10-CM | POA: Diagnosis not present

## 2024-05-21 DIAGNOSIS — R2689 Other abnormalities of gait and mobility: Secondary | ICD-10-CM | POA: Diagnosis not present

## 2024-05-21 DIAGNOSIS — I10 Essential (primary) hypertension: Secondary | ICD-10-CM | POA: Diagnosis not present

## 2024-05-21 DIAGNOSIS — M810 Age-related osteoporosis without current pathological fracture: Secondary | ICD-10-CM | POA: Diagnosis not present

## 2024-05-21 DIAGNOSIS — R748 Abnormal levels of other serum enzymes: Secondary | ICD-10-CM | POA: Diagnosis not present

## 2024-05-21 DIAGNOSIS — M549 Dorsalgia, unspecified: Secondary | ICD-10-CM | POA: Diagnosis not present

## 2024-05-21 DIAGNOSIS — M5441 Lumbago with sciatica, right side: Secondary | ICD-10-CM | POA: Diagnosis not present

## 2024-05-21 DIAGNOSIS — K7581 Nonalcoholic steatohepatitis (NASH): Secondary | ICD-10-CM | POA: Diagnosis not present

## 2024-05-21 DIAGNOSIS — M5489 Other dorsalgia: Secondary | ICD-10-CM | POA: Diagnosis not present

## 2024-05-21 DIAGNOSIS — M5442 Lumbago with sciatica, left side: Secondary | ICD-10-CM | POA: Diagnosis not present

## 2024-05-21 DIAGNOSIS — K219 Gastro-esophageal reflux disease without esophagitis: Secondary | ICD-10-CM | POA: Diagnosis not present

## 2024-05-21 DIAGNOSIS — E119 Type 2 diabetes mellitus without complications: Secondary | ICD-10-CM | POA: Diagnosis not present

## 2024-05-21 DIAGNOSIS — E7800 Pure hypercholesterolemia, unspecified: Secondary | ICD-10-CM | POA: Diagnosis not present

## 2024-05-24 DIAGNOSIS — M6281 Muscle weakness (generalized): Secondary | ICD-10-CM | POA: Diagnosis not present

## 2024-05-24 DIAGNOSIS — S32010D Wedge compression fracture of first lumbar vertebra, subsequent encounter for fracture with routine healing: Secondary | ICD-10-CM | POA: Diagnosis not present

## 2024-05-29 DIAGNOSIS — M6281 Muscle weakness (generalized): Secondary | ICD-10-CM | POA: Diagnosis not present

## 2024-06-05 DIAGNOSIS — M6281 Muscle weakness (generalized): Secondary | ICD-10-CM | POA: Diagnosis not present

## 2024-06-05 DIAGNOSIS — S32010D Wedge compression fracture of first lumbar vertebra, subsequent encounter for fracture with routine healing: Secondary | ICD-10-CM | POA: Diagnosis not present

## 2024-07-18 DIAGNOSIS — R051 Acute cough: Secondary | ICD-10-CM | POA: Diagnosis not present

## 2024-07-18 DIAGNOSIS — J028 Acute pharyngitis due to other specified organisms: Secondary | ICD-10-CM | POA: Diagnosis not present

## 2024-07-18 DIAGNOSIS — Z03818 Encounter for observation for suspected exposure to other biological agents ruled out: Secondary | ICD-10-CM | POA: Diagnosis not present
